# Patient Record
Sex: Female | Born: 1980
Health system: Southern US, Community
[De-identification: ages and names within clinical notes are randomized; demographics above are authoritative.]

---

## 2009-03-25 ENCOUNTER — Ambulatory Visit: Payer: Self-pay

## 2009-07-23 ENCOUNTER — Ambulatory Visit: Payer: Self-pay | Admitting: Surgery

## 2009-07-30 ENCOUNTER — Ambulatory Visit: Payer: Self-pay | Admitting: Surgery

## 2010-12-20 ENCOUNTER — Encounter: Payer: Self-pay | Admitting: Obstetrics and Gynecology

## 2011-04-22 ENCOUNTER — Observation Stay: Payer: Self-pay | Admitting: Obstetrics and Gynecology

## 2011-04-22 LAB — CBC WITH DIFFERENTIAL/PLATELET
Eosinophil %: 0.6 %
Lymphocyte #: 1.2 10*3/uL (ref 1.0–3.6)
Lymphocyte %: 11.4 %
MCH: 34.4 pg — ABNORMAL HIGH (ref 26.0–34.0)
MCHC: 35.1 g/dL (ref 32.0–36.0)
MCV: 98 fL (ref 80–100)
Monocyte #: 0.7 10*3/uL (ref 0.0–0.7)
Monocyte %: 7.2 %
Neutrophil #: 8.2 10*3/uL — ABNORMAL HIGH (ref 1.4–6.5)
Neutrophil %: 80.1 %
Platelet: 153 10*3/uL (ref 150–440)
RBC: 3.13 10*6/uL — ABNORMAL LOW (ref 3.80–5.20)
RDW: 13.3 % (ref 11.5–14.5)
WBC: 10.3 10*3/uL (ref 3.6–11.0)

## 2011-04-22 LAB — URINALYSIS, COMPLETE
Bacteria: NONE SEEN
Bilirubin,UR: NEGATIVE
Glucose,UR: NEGATIVE mg/dL (ref 0–75)
Leukocyte Esterase: NEGATIVE
Nitrite: NEGATIVE
RBC,UR: 175 /HPF (ref 0–5)
Specific Gravity: 1.008 (ref 1.003–1.030)
WBC UR: NONE SEEN /HPF (ref 0–5)

## 2011-04-24 LAB — URINE CULTURE

## 2011-07-09 ENCOUNTER — Inpatient Hospital Stay: Payer: Self-pay

## 2011-07-09 LAB — CBC WITH DIFFERENTIAL/PLATELET
Basophil %: 0.3 %
Eosinophil #: 0.1 10*3/uL (ref 0.0–0.7)
Eosinophil %: 0.6 %
HGB: 12.1 g/dL (ref 12.0–16.0)
Lymphocyte #: 1.4 10*3/uL (ref 1.0–3.6)
MCH: 34.4 pg — ABNORMAL HIGH (ref 26.0–34.0)
MCHC: 35.5 g/dL (ref 32.0–36.0)
MCV: 97 fL (ref 80–100)
Neutrophil #: 7.4 10*3/uL — ABNORMAL HIGH (ref 1.4–6.5)
Neutrophil %: 77.6 %
Platelet: 145 10*3/uL — ABNORMAL LOW (ref 150–440)
RDW: 13.3 % (ref 11.5–14.5)
WBC: 9.6 10*3/uL (ref 3.6–11.0)

## 2011-07-10 LAB — HEMATOCRIT: HCT: 32.2 % — ABNORMAL LOW (ref 35.0–47.0)

## 2011-07-25 IMAGING — US ULTRASOUND LEFT BREAST
1 series · 9 of 9 positions shown · non-contrast
Comparison: none

REASON FOR EXAM: L BR MASS  [id number]  5-7 CM
COMMENTS:

[Series 1: ultrasound left breast · 9 of 9 slices shown]
[im 1/9]
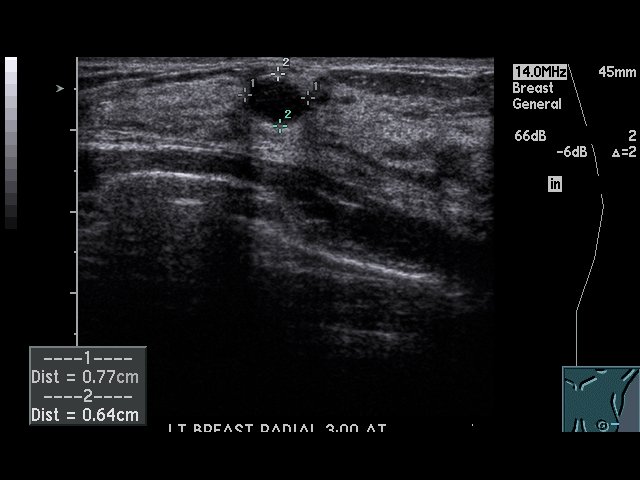
[im 2/9]
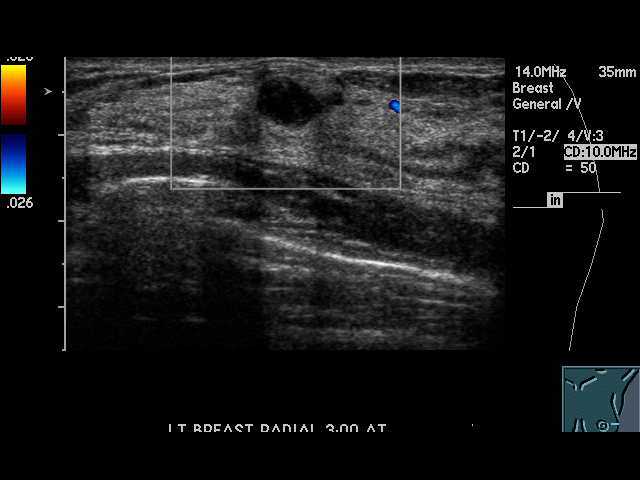
[im 3/9]
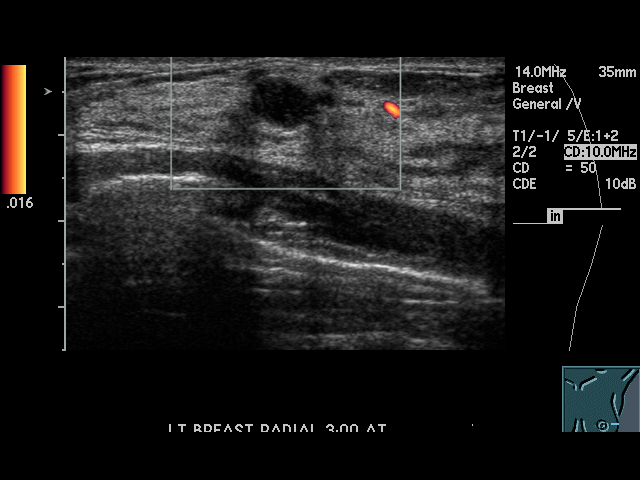
[im 4/9]
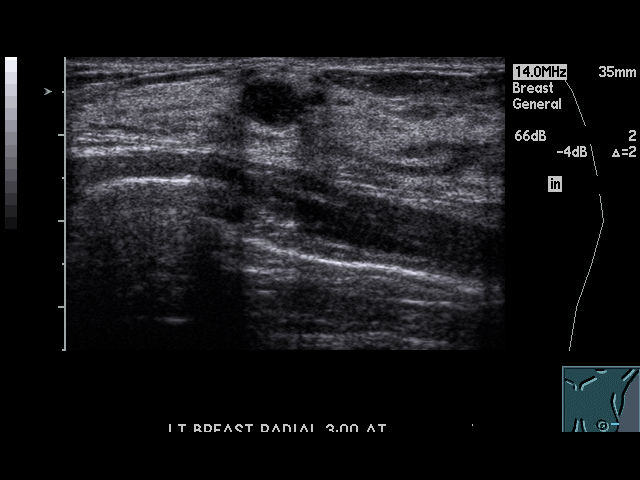
[im 5/9]
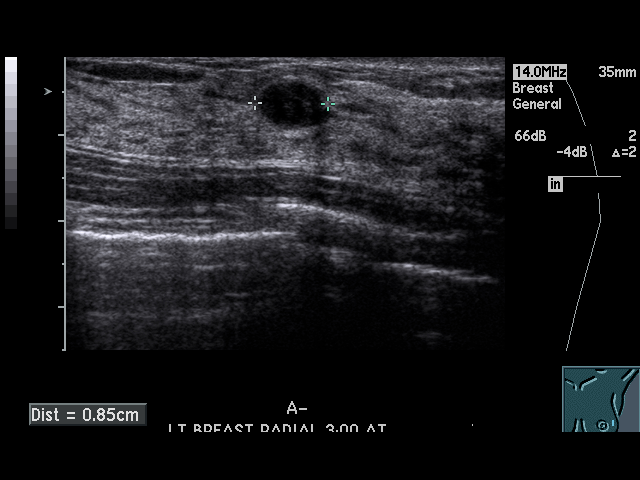
[im 6/9]
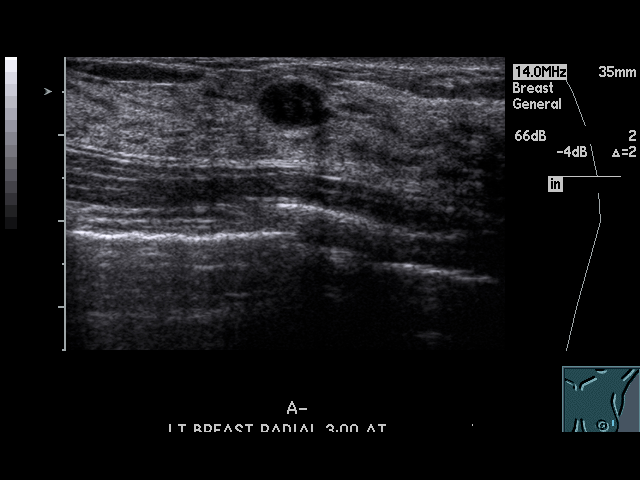
[im 7/9]
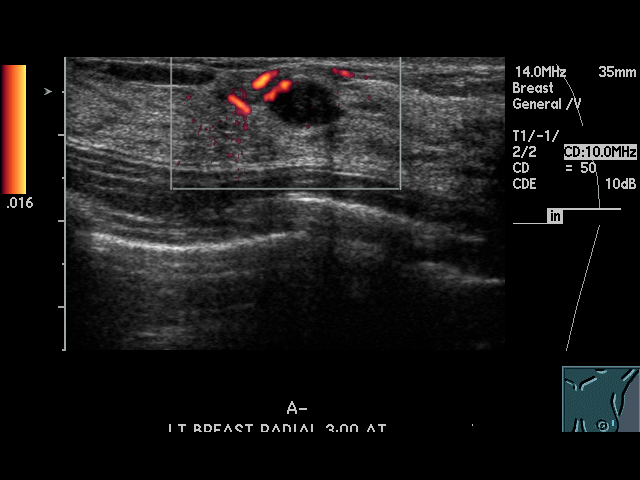
[im 8/9]
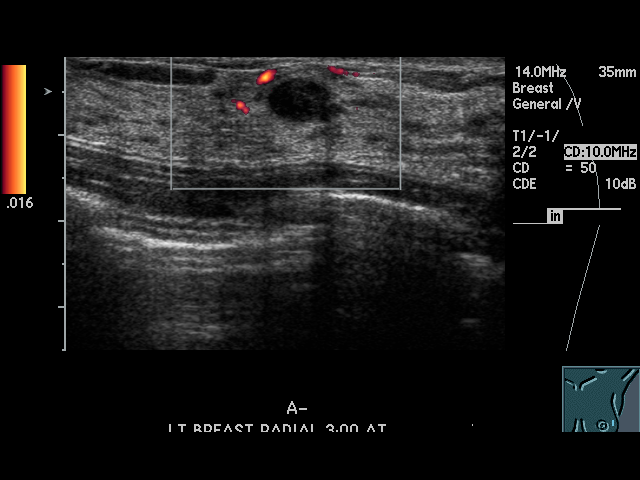
[im 9/9]
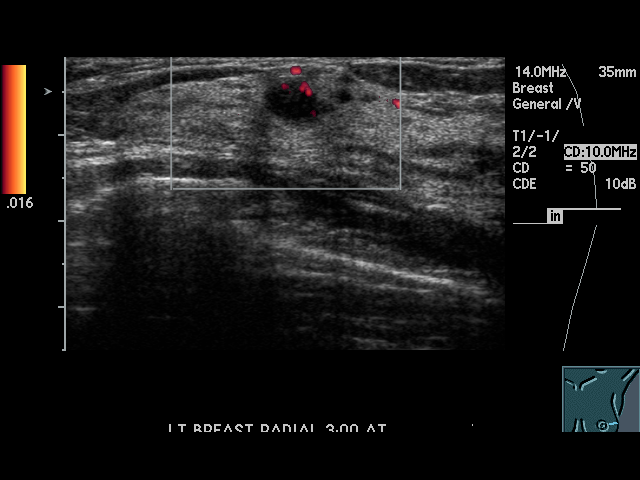

[9 of 9 positions shown; findings below may reference images not displayed]

PROCEDURE:     US  - US BREAST LEFT  - March 25, 2009 [DATE]

RESULT:     The patient has a palpable area of concern at 3 o'clock in the
left breast. Ultrasound targeted to this area shows an 8.5 mm, hypoechoic
mass. There is observed posterior enhancement; however, internal echoes are
seen and on Doppler examination there is observed vascularity associated
with the superficial peripheral margin. This may represent an inflammatory
cyst but the possibility of a solid nodule such as fibroadenoma cannot be
excluded. Since the findings are not entirely specific sonographically,
surgical consultation for consideration of biopsy or aspiration is
recommended
IMPRESSION: There is a nonspecific hypoechoic nodule at 3 o'clock.
Surgical consultation is recommended for consideration of aspiration or
biopsy.

## 2011-11-29 IMAGING — US US NEEDLE LOCALIZATION*L*
1 series · 11 of 11 positions shown · non-contrast
Comparison: none

COMMENTS:

PROCEDURE:     US  - US GUIDED NEEDLE LOCAL L BREAST  - July 30, 2009 [DATE]
RESULT:     The anticipated procedure was discussed with Ms. Anifa. She
voiced her willingness to proceed. The lesion was imaged preliminarily with
ultrasound prior to prepping the patient. A time out procedure was called.
The skin over the lateral aspect of the left breast was marked with an
indelible marker. The skin was cleansed with an iodine solution. The
surrounding tissues were sterilely draped. The subcutaneous tissues were
infiltrated with approximately 4 cc of 1% lidocaine with an equal volume of
bicarbonate. Subsequently a 7 mm Irvin Billiot wire was introduced into the
subcutaneous tissues and then into the hypoechoic nodule. Positioning was
confirmed with the tip of the needle in the middle of the nodule. The
carrier needle was withdrawn and the marker tip present within the lesion.
The patient tolerated the procedure well.

[Series 1: us needle localization*left* · 11 of 11 slices shown]
[im 1/11]
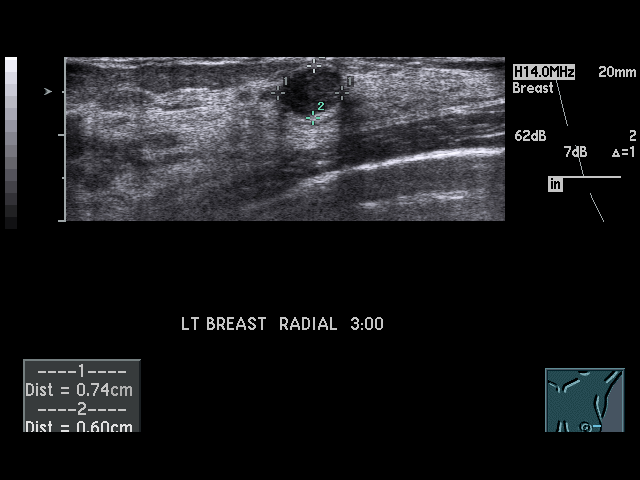
[im 2/11]
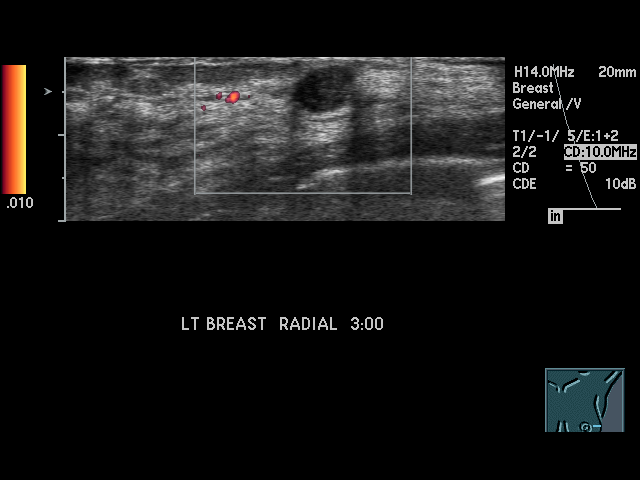
[im 3/11]
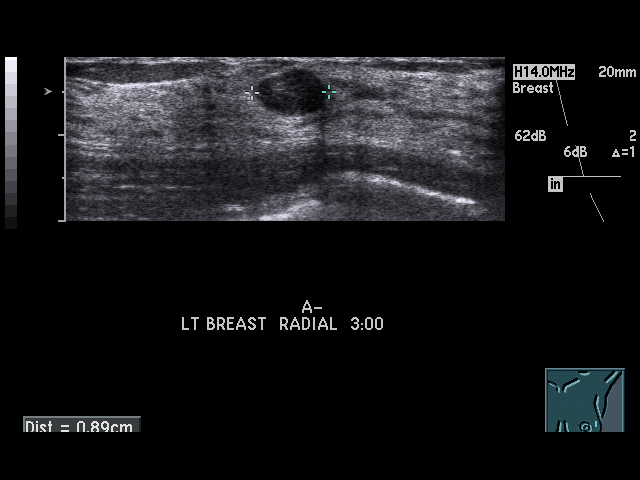
[im 4/11]
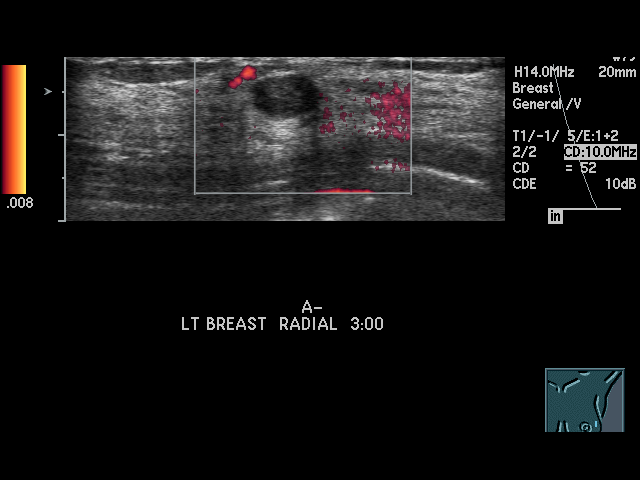
[im 5/11]
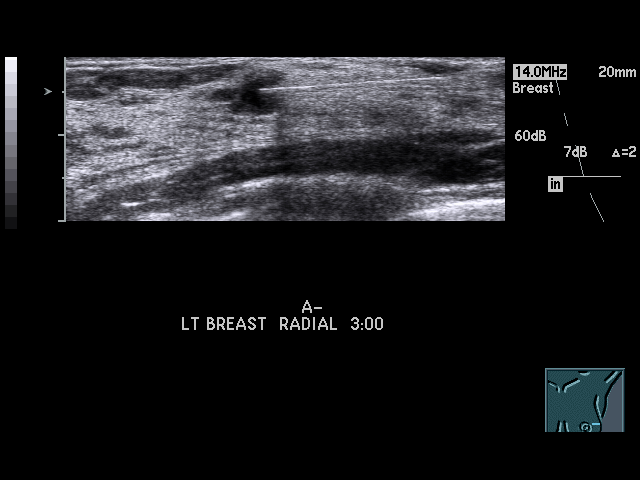
[im 6/11]
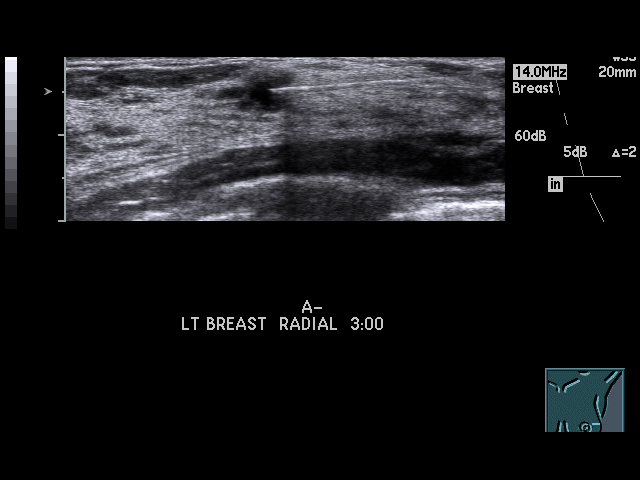
[im 7/11]
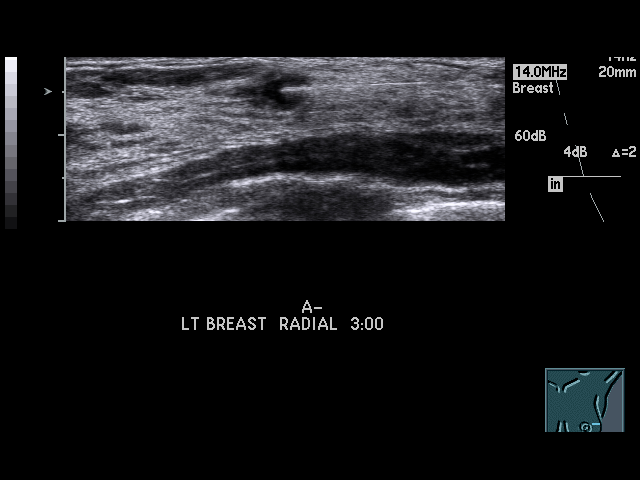
[im 8/11]
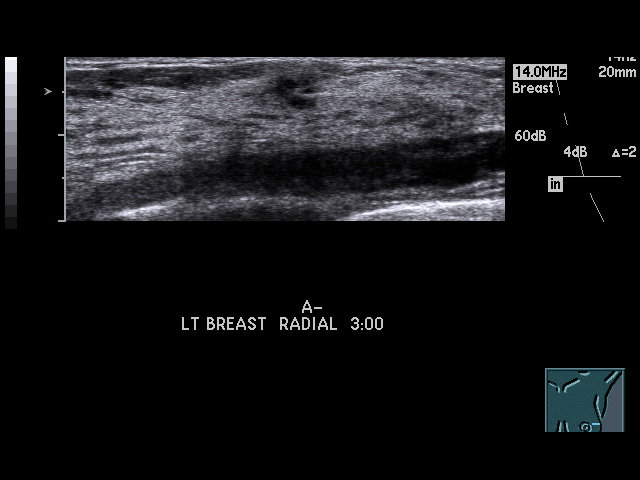
[im 9/11]
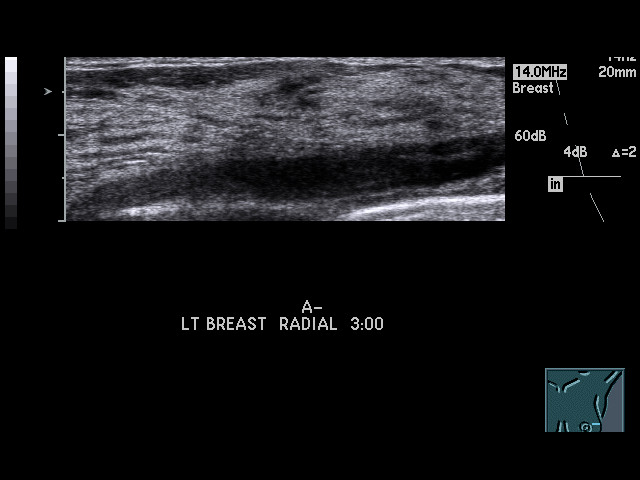
[im 10/11]
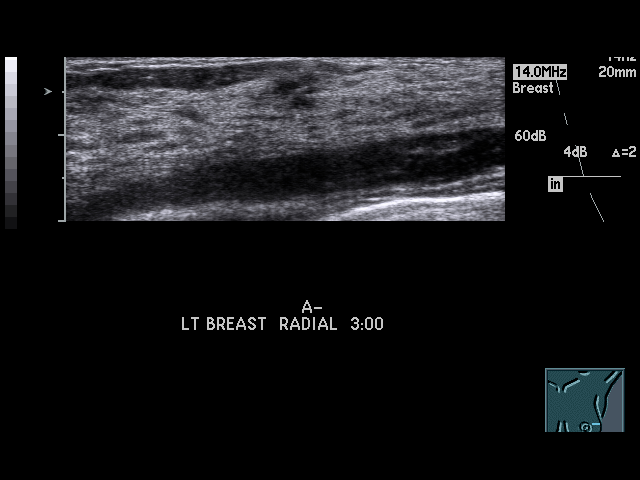
[im 11/11]
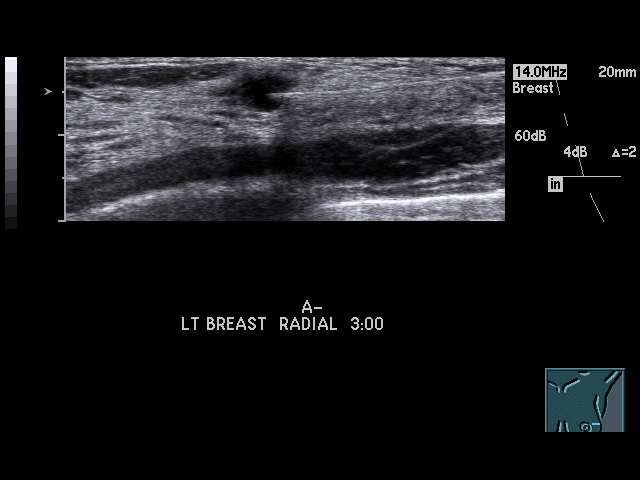

[11 of 11 positions shown; findings below may reference images not displayed]

IMPRESSION: The patient underwent successful ultrasound directed wire
localization of a hypoechoic nodule at the [DATE] position of the left breast.
The patient was taken to [REDACTED] in good condition.

## 2013-07-10 ENCOUNTER — Other Ambulatory Visit: Payer: Self-pay | Admitting: Family Medicine

## 2013-07-10 LAB — COMPREHENSIVE METABOLIC PANEL
ALK PHOS: 48 U/L
ALT: 19 U/L (ref 12–78)
Albumin: 4.1 g/dL (ref 3.4–5.0)
Anion Gap: 6 — ABNORMAL LOW (ref 7–16)
BUN: 16 mg/dL (ref 7–18)
Bilirubin,Total: 0.9 mg/dL (ref 0.2–1.0)
CO2: 28 mmol/L (ref 21–32)
CREATININE: 0.86 mg/dL (ref 0.60–1.30)
Calcium, Total: 9.2 mg/dL (ref 8.5–10.1)
Chloride: 104 mmol/L (ref 98–107)
EGFR (Non-African Amer.): >60
Glucose: 77 mg/dL (ref 65–99)
Osmolality: 276 (ref 275–301)
Potassium: 3.8 mmol/L (ref 3.5–5.1)
SGOT(AST): 15 U/L (ref 15–37)
Sodium: 138 mmol/L (ref 136–145)
Total Protein: 7.8 g/dL (ref 6.4–8.2)

## 2013-07-10 LAB — CBC WITH DIFFERENTIAL/PLATELET
BASOS ABS: 0 10*3/uL (ref 0.0–0.1)
Basophil %: 0.7 %
Eosinophil #: 0 10*3/uL (ref 0.0–0.7)
Eosinophil %: 0.8 %
HCT: 41.9 % (ref 35.0–47.0)
HGB: 13.9 g/dL (ref 12.0–16.0)
LYMPHS PCT: 43.2 %
Lymphocyte #: 1.9 10*3/uL (ref 1.0–3.6)
MCH: 30.1 pg (ref 26.0–34.0)
MCHC: 33.2 g/dL (ref 32.0–36.0)
MCV: 91 fL (ref 80–100)
MONOS PCT: 7.6 %
Monocyte #: 0.3 x10 3/mm (ref 0.2–0.9)
NEUTROS ABS: 2.1 10*3/uL (ref 1.4–6.5)
Neutrophil %: 47.7 %
PLATELETS: 134 10*3/uL — AB (ref 150–440)
RBC: 4.62 10*6/uL (ref 3.80–5.20)
RDW: 12.7 % (ref 11.5–14.5)
WBC: 4.3 10*3/uL (ref 3.6–11.0)

## 2013-07-10 LAB — IRON AND TIBC
Iron Bind.Cap.(Total): 342 ug/dL (ref 250–450)
Iron Saturation: 32 %
Iron: 109 ug/dL (ref 50–170)
Unbound Iron-Bind.Cap.: 233 ug/dL

## 2013-07-10 LAB — FOLATE: Folic Acid: 46.1 ng/mL (ref 3.1–100.0)

## 2013-07-10 LAB — LIPID PANEL
Cholesterol: 162 mg/dL (ref 0–200)
HDL Cholesterol: 70 mg/dL — ABNORMAL HIGH (ref 40–60)
LDL CHOLESTEROL, CALC: 81 mg/dL (ref 0–100)
TRIGLYCERIDES: 57 mg/dL (ref 0–200)
VLDL CHOLESTEROL, CALC: 11 mg/dL (ref 5–40)

## 2013-07-10 LAB — TSH: THYROID STIMULATING HORM: 1.74 u[IU]/mL

## 2013-07-10 LAB — FERRITIN: Ferritin (ARMC): 37 ng/mL (ref 8–388)

## 2013-08-06 ENCOUNTER — Other Ambulatory Visit: Payer: Self-pay | Admitting: Family Medicine

## 2013-08-06 LAB — CBC WITH DIFFERENTIAL/PLATELET
Basophil #: 0 10*3/uL (ref 0.0–0.1)
Basophil %: 0.6 %
EOS ABS: 0 10*3/uL (ref 0.0–0.7)
Eosinophil %: 0.5 %
HCT: 41.5 % (ref 35.0–47.0)
HGB: 14.1 g/dL (ref 12.0–16.0)
LYMPHS ABS: 2.1 10*3/uL (ref 1.0–3.6)
Lymphocyte %: 38.4 %
MCH: 30.8 pg (ref 26.0–34.0)
MCHC: 34 g/dL (ref 32.0–36.0)
MCV: 91 fL (ref 80–100)
MONOS PCT: 5.9 %
Monocyte #: 0.3 x10 3/mm (ref 0.2–0.9)
NEUTROS ABS: 3 10*3/uL (ref 1.4–6.5)
NEUTROS PCT: 54.6 %
Platelet: 140 10*3/uL — ABNORMAL LOW (ref 150–440)
RBC: 4.58 10*6/uL (ref 3.80–5.20)
RDW: 12.7 % (ref 11.5–14.5)
WBC: 5.5 10*3/uL (ref 3.6–11.0)

## 2013-08-21 ENCOUNTER — Ambulatory Visit: Payer: Self-pay | Admitting: Internal Medicine

## 2013-08-21 LAB — CBC CANCER CENTER
Basophil #: 0 x10 3/mm (ref 0.0–0.1)
Basophil %: 0.5 %
Comment - H1-Com1: NORMAL
Comment - H1-Com2: NORMAL
Eosinophil #: 0 x10 3/mm (ref 0.0–0.7)
Eosinophil %: 0.8 %
HCT: 40.4 % (ref 35.0–47.0)
HGB: 14.1 g/dL (ref 12.0–16.0)
LYMPHS ABS: 2 x10 3/mm (ref 1.0–3.6)
Lymphocyte %: 40 %
Lymphocytes: 40 %
MCH: 31.9 pg (ref 26.0–34.0)
MCHC: 34.9 g/dL (ref 32.0–36.0)
MCV: 92 fL (ref 80–100)
MONO ABS: 0.4 x10 3/mm (ref 0.2–0.9)
MONOS PCT: 8 %
Monocyte %: 7.5 %
Neutrophil #: 2.6 x10 3/mm (ref 1.4–6.5)
Neutrophil %: 51.2 %
Platelet: 164 x10 3/mm (ref 150–440)
RBC: 4.41 10*6/uL (ref 3.80–5.20)
RDW: 12.6 % (ref 11.5–14.5)
Segmented Neutrophils: 52 %
WBC: 5.1 x10 3/mm (ref 3.6–11.0)

## 2013-08-21 LAB — APTT: Activated PTT: 27.8 secs (ref 23.6–35.9)

## 2013-08-21 LAB — PROTIME-INR
INR: 0.9
Prothrombin Time: 12.5 secs (ref 11.5–14.7)

## 2013-08-21 LAB — RETICULOCYTES
Absolute Retic Count: 0.0616 10*6/uL (ref 0.019–0.186)
Reticulocyte: 1.4 % (ref 0.4–3.1)

## 2013-08-21 LAB — IRON AND TIBC
IRON SATURATION: 19 %
Iron Bind.Cap.(Total): 428 ug/dL (ref 250–450)
Iron: 80 ug/dL (ref 50–170)
Unbound Iron-Bind.Cap.: 348 ug/dL

## 2013-08-21 LAB — FERRITIN: Ferritin (ARMC): 28 ng/mL (ref 8–388)

## 2013-08-21 LAB — FOLATE: Folic Acid: 19.3 ng/mL (ref 3.1–100.0)

## 2013-09-11 ENCOUNTER — Ambulatory Visit: Payer: Self-pay | Admitting: Internal Medicine

## 2014-07-22 NOTE — H&P (Signed)
L&D Evaluation:  History:   HPI 34 year old G1P0 presents to L&D with c/o leaking fluid since 0730 this AM. 38 weeks today, EDD 07/23/11. 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 Labs: A POS, + anti M antibody, RI, VI, GBS NEgative    Presents with leaking fluid    Patient's Medical History depression, abnormal pap smear, breast fibroadenoma    Patient's Surgical History LEEP  breast surgery    Medications Pre Natal Vitamins    Allergies PCN    Social History none    Family History Non-Contributory   ROS:   ROS All systems were reviewed.  HEENT, CNS, GI, GU, Respiratory, CV, Renal and Musculoskeletal systems were found to be normal.   Exam:   Vital Signs stable    Urine Protein not completed    General no apparent distress    Mental Status clear    Chest clear    Abdomen gravid, non-tender    Estimated Fetal Weight Average for gestational age    Back no CVAT    Edema no edema    Pelvic no external lesions, 3/90 per RN    Mebranes Ruptured, nitrazine positive, fluid noted on peripad and underpad    Description clear    FHT normal rate with no decels    Ucx irregular    Skin dry   Impression:   Impression early labor, 38 week IUP, SROM   Plan:   Plan EFM/NST, admit for labor, possible pitocin if no cervical change   Electronic Signatures: Shella MaximPutnam, Levy Wellman (CNM)  (Signed 27-Apr-13 11:41)  Authored: L&D Evaluation   Last Updated: 27-Apr-13 11:41 by Shella MaximPutnam, Ambre Kobayashi (CNM)
